# Patient Record
Sex: Female | Born: 1975 | Race: Black or African American | Hispanic: No | Marital: Married | Smoking: Never smoker
Health system: Southern US, Community
[De-identification: ages and names within clinical notes are randomized; demographics above are authoritative.]

## PROBLEM LIST (undated history)

## (undated) DIAGNOSIS — I10 Essential (primary) hypertension: Secondary | ICD-10-CM

## (undated) HISTORY — PX: PARTIAL HYSTERECTOMY: SHX80

---

## 2003-03-13 ENCOUNTER — Other Ambulatory Visit: Payer: Self-pay

## 2004-02-18 ENCOUNTER — Emergency Department: Payer: Self-pay | Admitting: Emergency Medicine

## 2004-08-07 ENCOUNTER — Emergency Department: Payer: Self-pay | Admitting: Internal Medicine

## 2004-11-26 ENCOUNTER — Emergency Department: Payer: Self-pay | Admitting: Emergency Medicine

## 2006-04-13 ENCOUNTER — Emergency Department: Payer: Self-pay | Admitting: Emergency Medicine

## 2007-09-10 ENCOUNTER — Emergency Department: Payer: Self-pay | Admitting: Emergency Medicine

## 2008-09-24 ENCOUNTER — Emergency Department: Payer: Self-pay | Admitting: Emergency Medicine

## 2015-01-12 ENCOUNTER — Emergency Department
Admission: EM | Admit: 2015-01-12 | Discharge: 2015-01-12 | Disposition: A | Discharge status: Home / Self Care | Payer: No Typology Code available for payment source | Attending: Emergency Medicine | Admitting: Emergency Medicine

## 2015-01-12 ENCOUNTER — Encounter: Payer: Self-pay | Admitting: Emergency Medicine

## 2015-01-12 DIAGNOSIS — I10 Essential (primary) hypertension: Secondary | ICD-10-CM | POA: Diagnosis not present

## 2015-01-12 DIAGNOSIS — J029 Acute pharyngitis, unspecified: Secondary | ICD-10-CM | POA: Insufficient documentation

## 2015-01-12 DIAGNOSIS — B349 Viral infection, unspecified: Secondary | ICD-10-CM | POA: Insufficient documentation

## 2015-01-12 HISTORY — DX: Essential (primary) hypertension: I10

## 2015-01-12 LAB — POCT RAPID STREP A: Streptococcus, Group A Screen (Direct): NEGATIVE

## 2015-01-12 MED ORDER — MAGIC MOUTHWASH W/LIDOCAINE: 5.0000 mL | Freq: Four times a day (QID) | ORAL | Status: DC

## 2015-01-12 MED ORDER — PROMETHAZINE-DM 6.25-15 MG/5ML PO SYRP: 5.0000 mL | ORAL_SOLUTION | Freq: Four times a day (QID) | ORAL | Status: DC | PRN

## 2015-01-12 NOTE — Discharge Instructions (Signed)

## 2015-01-12 NOTE — ED Notes (Signed)
Pt C/o sore throat and chills x 1 week. Pt states she has been taking OTC sinus medications with no relief.

## 2015-01-12 NOTE — ED Notes (Signed)
AAOx3.  Skin warm and dry.  NAD 

## 2015-01-12 NOTE — ED Provider Notes (Signed)
[facility] Emergency Department Provider Note  ____________________________________________  Time seen: Approximately 4:54 PM  I have reviewed the triage vital signs and the nursing notes.   HISTORY  Chief Complaint Sore Throat and Chills    HPI Christine Cameron is a 39 y.o. female patient complaining of sore throat and sinus congestion chills and fever for one week. Patient states she's been using over-the-counter sinus medication with no relief. Patient states the sore throat has increased in the last 2 days. Patient said is painful with swallowing even fluids. Patient is rating her pain discomfort as 8/10.Patient describes pain as sharp with swallowing. Patient denies any nausea vomiting diarrhea with this complaint. Patient denies any body aches.  Past Medical History  Diagnosis Date  . Hypertension     There are no active problems to display for this patient.   Past Surgical History  Procedure Laterality Date  . Partial hysterectomy      Current Outpatient Rx  Name  Route  Sig  Dispense  Refill  . magic mouthwash w/lidocaine SOLN   Oral   Take 5 mLs by mouth 4 (four) times daily.   100 mL   0   . promethazine-dextromethorphan (PROMETHAZINE-DM) 6.25-15 MG/5ML syrup   Oral   Take 5 mLs by mouth 4 (four) times daily as needed for cough.   118 mL   0     Allergies Review of patient's allergies indicates no known allergies.  History reviewed. No pertinent family history.  Social History Social History  Substance Use Topics  . Smoking status: Never Smoker   . Smokeless tobacco: Never Used  . Alcohol Use: Yes     Comment: Occassionally    Review of Systems Constitutional: No fever/chills Eyes: No visual changes. ENT: Sore throat and sinus congestion. Cardiovascular: Denies chest pain. Respiratory: Denies shortness of breath. Gastrointestinal: No abdominal pain.  No nausea, no vomiting.  No diarrhea.  No  constipation. Genitourinary: Negative for dysuria. Musculoskeletal: Negative for back pain. Skin: Negative for rash. Neurological: Negative for headaches, focal weakness or numbness. Psychiatric: Endocrine:Hypertension 10-point ROS otherwise negative.  ____________________________________________   PHYSICAL EXAM:  VITAL SIGNS: ED Triage Vitals  Enc Vitals Group     BP 01/12/15 1630 147/93 mmHg     Pulse Rate 01/12/15 1630 71     Resp 01/12/15 1630 18     Temp 01/12/15 1630 98.4 F (36.9 C)     Temp src --      SpO2 01/12/15 1630 100 %     Weight 01/12/15 1630 170 lb (77.111 kg)     Height 01/12/15 1630  (1.626 m)     Head Cir --      Peak Flow --      Pain Score 01/12/15 1631 8     Pain Loc --      Pain Edu? --      Excl. in GC? --     Constitutional: Alert and oriented. Well appearing and in no acute distress. Eyes: Conjunctivae are normal. PERRL. EOMI. Head: Atraumatic. Nose: No congestion/rhinnorhea. Mouth/Throat: Mucous membranes are moist.  Oropharynx erythematous, edematous tonsils without exudate. Neck: No stridor.  No cervical spine tenderness to palpation. Hematological/Lymphatic/Immunilogical: No cervical lymphadenopathy. Cardiovascular: Normal rate, regular rhythm. Grossly normal heart sounds.  Good peripheral circulation. Respiratory: Normal respiratory effort.  No retractions. Lungs CTAB. Gastrointestinal: Soft and nontender. No distention. No abdominal bruits. No CVA tenderness. Musculoskeletal: No lower extremity tenderness nor edema.  No joint effusions.  Neurologic:  Normal speech and language. No gross focal neurologic deficits are appreciated. No gait instability. Skin:  Skin is warm, dry and intact. No rash noted. Psychiatric: Mood and affect are normal. Speech and behavior are normal.  ____________________________________________   LABS (all labs ordered are listed, but only abnormal results are displayed)  Labs Reviewed  CULTURE,  GROUP A STREP ([facility] ONLY)  POCT RAPID STREP A   ____________________________________________  EKG   ____________________________________________  RADIOLOGY   ____________________________________________   PROCEDURES  Procedure(s) performed: None  Critical Care performed: No  ____________________________________________   INITIAL IMPRESSION / ASSESSMENT AND PLAN / ED COURSE  Pertinent labs & imaging results that were available during my care of the patient were reviewed by me and considered in my medical decision making (see chart for details). Viral pharyngitis. Discussed negatives rapid strep results with patient. Advised patient culture is pending. Patient given prescription for Bromfed DM and Magic mouthwash. Patient advised to follow-up ____________________________________________   FINAL CLINICAL IMPRESSION(S) / ED DIAGNOSES  Final diagnoses:  Pharyngitis with viral syndrome      Joni ReiningRonald K Celie Desrochers, PA-C 01/12/15 1711  Darien Ramusavid W Kaminski, MD 01/12/15 2008

## 2015-01-16 LAB — CULTURE, GROUP A STREP (THRC)

## 2018-06-11 ENCOUNTER — Encounter: Payer: Self-pay | Admitting: Emergency Medicine

## 2018-06-11 ENCOUNTER — Emergency Department
Admission: EM | Admit: 2018-06-11 | Discharge: 2018-06-11 | Disposition: A | Discharge status: Home / Self Care | Payer: Commercial Managed Care - PPO | Attending: Emergency Medicine | Admitting: Emergency Medicine

## 2018-06-11 ENCOUNTER — Emergency Department: Payer: Commercial Managed Care - PPO

## 2018-06-11 ENCOUNTER — Other Ambulatory Visit: Payer: Self-pay

## 2018-06-11 DIAGNOSIS — R1030 Lower abdominal pain, unspecified: Secondary | ICD-10-CM | POA: Diagnosis present

## 2018-06-11 DIAGNOSIS — B9689 Other specified bacterial agents as the cause of diseases classified elsewhere: Secondary | ICD-10-CM | POA: Insufficient documentation

## 2018-06-11 DIAGNOSIS — N760 Acute vaginitis: Secondary | ICD-10-CM | POA: Insufficient documentation

## 2018-06-11 DIAGNOSIS — R102 Pelvic and perineal pain: Secondary | ICD-10-CM

## 2018-06-11 DIAGNOSIS — I10 Essential (primary) hypertension: Secondary | ICD-10-CM | POA: Insufficient documentation

## 2018-06-11 LAB — URINALYSIS, COMPLETE (UACMP) WITH MICROSCOPIC
BACTERIA UA: NONE SEEN
Bilirubin Urine: NEGATIVE
Glucose, UA: NEGATIVE mg/dL
Ketones, ur: NEGATIVE mg/dL
LEUKOCYTE UA: NEGATIVE
Nitrite: NEGATIVE
Protein, ur: NEGATIVE mg/dL
Specific Gravity, Urine: 1.011 (ref 1.005–1.030)
pH: 5 (ref 5.0–8.0)

## 2018-06-11 LAB — COMPREHENSIVE METABOLIC PANEL
ALK PHOS: 24 U/L — AB (ref 38–126)
ALT: 16 U/L (ref 0–44)
AST: 19 U/L (ref 15–41)
Albumin: 4.4 g/dL (ref 3.5–5.0)
Anion gap: 8 (ref 5–15)
BUN: 12 mg/dL (ref 6–20)
CO2: 25 mmol/L (ref 22–32)
Calcium: 9.1 mg/dL (ref 8.9–10.3)
Chloride: 103 mmol/L (ref 98–111)
Creatinine, Ser: 0.82 mg/dL (ref 0.44–1.00)
GFR calc Af Amer: >60 mL/min (ref >60)
GFR calc non Af Amer: >60 mL/min (ref >60)
Glucose, Bld: 107 mg/dL — ABNORMAL HIGH (ref 70–99)
Potassium: 3.2 mmol/L — ABNORMAL LOW (ref 3.5–5.1)
Sodium: 136 mmol/L (ref 135–145)
Total Bilirubin: 0.8 mg/dL (ref 0.3–1.2)
Total Protein: 7.6 g/dL (ref 6.5–8.1)

## 2018-06-11 LAB — CBC
HCT: 38.3 % (ref 36.0–46.0)
Hemoglobin: 12.6 g/dL (ref 12.0–15.0)
MCH: 29.4 pg (ref 26.0–34.0)
MCHC: 32.9 g/dL (ref 30.0–36.0)
MCV: 89.5 fL (ref 80.0–100.0)
NRBC: 0 % (ref 0.0–0.2)
Platelets: 370 10*3/uL (ref 150–400)
RBC: 4.28 MIL/uL (ref 3.87–5.11)
RDW: 12.6 % (ref 11.5–15.5)
WBC: 11.1 10*3/uL — ABNORMAL HIGH (ref 4.0–10.5)

## 2018-06-11 LAB — WET PREP, GENITAL
Sperm: NONE SEEN
TRICH WET PREP: NONE SEEN
WBC, Wet Prep HPF POC: NONE SEEN
Yeast Wet Prep HPF POC: NONE SEEN

## 2018-06-11 LAB — LIPASE, BLOOD: Lipase: 29 U/L (ref 11–51)

## 2018-06-11 LAB — CHLAMYDIA/NGC RT PCR (ARMC ONLY)
Chlamydia Tr: NOT DETECTED
N gonorrhoeae: NOT DETECTED

## 2018-06-11 MED ORDER — IOPAMIDOL (ISOVUE-300) INJECTION 61%
30.0000 mL | Freq: Once | INTRAVENOUS | Status: AC
  Administered 2018-06-11: 30 mL via ORAL

## 2018-06-11 MED ORDER — METRONIDAZOLE 500 MG PO TABS: 500.0000 mg | ORAL_TABLET | Freq: Two times a day (BID) | ORAL | 0 refills | Status: DC

## 2018-06-11 MED ORDER — IOHEXOL 300 MG/ML  SOLN
100.0000 mL | Freq: Once | INTRAMUSCULAR | Status: AC | PRN
  Administered 2018-06-11: 100 mL via INTRAVENOUS

## 2018-06-11 MED ORDER — KETOROLAC TROMETHAMINE 30 MG/ML IJ SOLN
30.0000 mg | Freq: Once | INTRAMUSCULAR | Status: AC
  Administered 2018-06-11: 30 mg via INTRAMUSCULAR
  Filled 2018-06-11: qty 1

## 2018-06-11 MED ORDER — METRONIDAZOLE 500 MG PO TABS
500.0000 mg | ORAL_TABLET | Freq: Once | ORAL | Status: AC
  Administered 2018-06-11: 500 mg via ORAL
  Filled 2018-06-11: qty 1

## 2018-06-11 NOTE — ED Notes (Signed)
Drinking oral CT contrast.

## 2018-06-11 NOTE — ED Notes (Signed)
ED Provider at bedside. 

## 2018-06-11 NOTE — ED Notes (Signed)
Returned from ultrasound.

## 2018-06-11 NOTE — ED Triage Notes (Signed)
C/o lower abdominal pain/pelvic pain. Worse with sex but hurts all the time. Denies urinary sx. Denies vaginal discharge. No fevers.  Ambulatory. VSS. Has had hysterectomy.

## 2018-06-11 NOTE — ED Notes (Signed)
Pt finished with oral CT contrast. CT staff informed

## 2018-06-11 NOTE — ED Notes (Signed)
Patient in ultrasound.

## 2018-06-11 NOTE — ED Provider Notes (Signed)
[facility] Emergency Department Provider Note   ____________________________________________    I have reviewed the triage vital signs and the nursing notes.   HISTORY  Chief Complaint Abdominal Pain     HPI Christine Cameron is a 43 y.o. female who presents with complaints of lower abdominal pain.  Patient describes cramping sensation primarily in her pelvis which is been occurring over the last 2 to 3 days.  She denies vaginal discharge.  No dysuria.  No nausea or vomiting.  Has a history of a hysterectomy.  No fevers or chills.  Normal stools.  Some constipation however   Past Medical History:  Diagnosis Date  . Hypertension     There are no active problems to display for this patient.   Past Surgical History:  Procedure Laterality Date  . PARTIAL HYSTERECTOMY      Prior to Admission medications   Medication Sig Start Date End Date Taking? Authorizing Provider  magic mouthwash w/lidocaine SOLN Take 5 mLs by mouth 4 (four) times daily. 01/12/15   Joni Reining, PA-C  metroNIDAZOLE (FLAGYL) 500 MG tablet Take 1 tablet (500 mg total) by mouth 2 (two) times daily after a meal. 06/11/18   Jene Every, MD  promethazine-dextromethorphan (PROMETHAZINE-DM) 6.25-15 MG/5ML syrup Take 5 mLs by mouth 4 (four) times daily as needed for cough. 01/12/15   Joni Reining, PA-C     Allergies Patient has no known allergies.  History reviewed. No pertinent family history.  Social History Social History   Tobacco Use  . Smoking status: Never Smoker  . Smokeless tobacco: Never Used  Substance Use Topics  . Alcohol use: Yes    Comment: Occassionally  . Drug use: No    Review of Systems  Constitutional: No fever/chills Eyes: No visual changes.  ENT: No sore throat. Cardiovascular: Denies chest pain. Respiratory: Denies shortness of breath. Gastrointestinal: As above Genitourinary: As above Musculoskeletal: Negative for back pain. Skin:  Negative for rash. Neurological: Negative for headaches or weakness   ____________________________________________   PHYSICAL EXAM:  VITAL SIGNS: ED Triage Vitals [06/11/18 1556]  Enc Vitals Group     BP (!) 153/95     Pulse Rate 87     Resp 16     Temp 98.7 F (37.1 C)     Temp Source Oral     SpO2 98 %     Weight 76.7 kg (169 lb)     Height 1.6 m (5\' 3" )     Head Circumference      Peak Flow      Pain Score 8     Pain Loc      Pain Edu?      Excl. in GC?     Constitutional: Alert and oriented. No acute distress. Pleasant and interactive Eyes: Conjunctivae are normal.  Head: Atraumatic.  Cardiovascular: Normal rate, regular rhythm. Grossly normal heart sounds.  Good peripheral circulation. Respiratory: Normal respiratory effort.  No retractions. Lungs CTAB. Gastrointestinal: Mild tenderness palpation the left lower quadrant and suprapubically, no distention, no CVA tenderness Genitourinary: Small amount of whitish discharge, otherwise unremarkable exam Musculoskeletal:  Warm and well perfused Neurologic:  Normal speech and language. No gross focal neurologic deficits are appreciated.  Skin:  Skin is warm, dry and intact. No rash noted. Psychiatric: Mood and affect are normal. Speech and behavior are normal.  ____________________________________________   LABS (all labs ordered are listed, but only abnormal results are displayed)  Labs Reviewed  WET  PREP, GENITAL - Abnormal; Notable for the following components:      Result Value   Clue Cells Wet Prep HPF POC PRESENT (*)    All other components within normal limits  COMPREHENSIVE METABOLIC PANEL - Abnormal; Notable for the following components:   Potassium 3.2 (*)    Glucose, Bld 107 (*)    Alkaline Phosphatase 24 (*)    All other components within normal limits  CBC - Abnormal; Notable for the following components:   WBC 11.1 (*)    All other components within normal limits  URINALYSIS, COMPLETE (UACMP)  WITH MICROSCOPIC - Abnormal; Notable for the following components:   Color, Urine STRAW (*)    APPearance CLEAR (*)    Hgb urine dipstick SMALL (*)    All other components within normal limits  CHLAMYDIA/NGC RT PCR ([facility] ONLY)  LIPASE, BLOOD   ____________________________________________  EKG  None ____________________________________________  RADIOLOGY  CT abdomen pelvis demonstrates possible ovarian cyst Sound demonstrates 3.6 mm in her right hemorrhagic cyst, no torsion ____________________________________________   PROCEDURES  Procedure(s) performed: No  Procedures   Critical Care performed: No ____________________________________________   INITIAL IMPRESSION / ASSESSMENT AND PLAN / ED COURSE  Pertinent labs & imaging results that were available during my care of the patient were reviewed by me and considered in my medical decision making (see chart for details).  Patient presents with lower abdominal pain as described above.  Differential includes urinary tract infection, diverticulitis, ovarian cyst.  Will obtain labs, treat with IM Toradol, obtain CT and reevaluate  CT demonstrates possible ovarian cyst, ultrasound performed which confirmed the diagnosis no evidence of torsion.  Patient feeling better after injection  Pelvic exam concerning for clue cells most consistent with bacterial vaginosis this is likely the cause of her discomfort.  Will treat with Flagyl, outpatient follow-up PCP    ____________________________________________   FINAL CLINICAL IMPRESSION(S) / ED DIAGNOSES  Final diagnoses:  Pelvic pain  Bacterial vaginosis        Note:  This document was prepared using Dragon voice recognition software and may include unintentional dictation errors.   Jene Every, MD 06/11/18 [phone]

## 2019-05-22 ENCOUNTER — Other Ambulatory Visit: Payer: Self-pay

## 2019-05-22 ENCOUNTER — Ambulatory Visit
Admission: EM | Admit: 2019-05-22 | Discharge: 2019-05-22 | Disposition: A | Discharge status: Home / Self Care | Payer: 59 | Attending: Urgent Care | Admitting: Urgent Care

## 2019-05-22 ENCOUNTER — Encounter: Payer: Self-pay | Admitting: Emergency Medicine

## 2019-05-22 ENCOUNTER — Ambulatory Visit (INDEPENDENT_AMBULATORY_CARE_PROVIDER_SITE_OTHER): Payer: 59

## 2019-05-22 DIAGNOSIS — R1032 Left lower quadrant pain: Secondary | ICD-10-CM

## 2019-05-22 DIAGNOSIS — K5900 Constipation, unspecified: Secondary | ICD-10-CM | POA: Insufficient documentation

## 2019-05-22 LAB — URINALYSIS, COMPLETE (UACMP) WITH MICROSCOPIC
Bilirubin Urine: NEGATIVE
Glucose, UA: NEGATIVE mg/dL
Hgb urine dipstick: NEGATIVE
Ketones, ur: NEGATIVE mg/dL
Leukocytes,Ua: NEGATIVE
Nitrite: NEGATIVE
Protein, ur: NEGATIVE mg/dL
RBC / HPF: NONE SEEN RBC/hpf (ref 0–5)
Specific Gravity, Urine: 1.01 (ref 1.005–1.030)
pH: 6.5 (ref 5.0–8.0)

## 2019-05-22 MED ORDER — POLYETHYLENE GLYCOL 3350 17 GM/SCOOP PO POWD: 17.0000 g | Freq: Every day | ORAL | 0 refills | Status: AC

## 2019-05-22 MED ORDER — DOCUSATE SODIUM 100 MG PO CAPS: 100.0000 mg | ORAL_CAPSULE | Freq: Two times a day (BID) | ORAL | 0 refills | Status: AC

## 2019-05-22 NOTE — Discharge Instructions (Signed)
It was very nice seeing you today in clinic. Thank you for entrusting me with your care.   Increase fluid, fiber, and water intake. Use medications as prescribed.   Make arrangements to follow up with your regular doctor in 1 week for re-evaluation if not improving. If your symptoms/condition worsens, please seek follow up care either here or in the ER. Please remember, our [facility] providers are "right here with you" when you need [place].   Again, it was my pleasure to take care of you today. Thank you for choosing our clinic. I hope that you start to feel better quickly.   Quentin Mulling, MSN, APRN, FNP-C, CEN Advanced Practice Provider [facility]

## 2019-05-22 NOTE — ED Triage Notes (Signed)
Patient c/o left side pain that started on Sunday. She reports at time it radiating into her back on the left side. Denies urinary symptoms but does reports taking AZO pills for about 2 days.

## 2019-05-22 NOTE — ED Provider Notes (Signed)
[place]   Name: Christine Cameron DOB: Aug 20, 1975 MRN: [id number] CSN: [id number] PCP: [facility]  Arrival date and time:  05/22/19 1002  Chief Complaint:  Flank Pain   NOTE: Prior to seeing the patient today, I have reviewed the triage nursing documentation and vital signs. Clinical staff has updated patient's PMH/PSHx, current medication list, and drug allergies/intolerances to ensure comprehensive history available to assist in medical decision making.   History:   HPI: Christine Cameron is a 44 y.o. female who presents today with complaints of pain in her LEFT lower back with radiation into her flank area. Symptoms intermittent since Sunday. Patient denies nausea, vomiting, and fevers/chills. She has not experienced any urinary symptoms; no dysuria, frequency, urgency, gross hematuria, or malodorous urine. Patient denies vaginal discharge. Patient reports intermittent abdominal pain that feels like gas pain. Last bowel movement was yesterday; patient describes stool as being hard and only a small amount. Patient reports that her pain improves with APAP, IBU, and a hot shower. Additionally, patient took phenazopyridine on Sunday, in addition to increasing her fluid intake (water and cranberry juice), which did not seem to improve her symptoms. Patient denies a history if recurrent urinary tract infection, urolithiasis, and STIs.  Past Medical History:  Diagnosis Date  . Hypertension     Past Surgical History:  Procedure Laterality Date  . PARTIAL HYSTERECTOMY      History reviewed. No pertinent family history.  Social History   Tobacco Use  . Smoking status: Never Smoker  . Smokeless tobacco: Never Used  Substance Use Topics  . Alcohol use: Yes    Comment: Occassionally  . Drug use: No    There are no problems to display for this patient.   Home Medications:    Current Meds  Medication Sig  . hydrochlorothiazide (HYDRODIURIL) 25 MG  tablet Take 25 mg by mouth daily.    Allergies:   Patient has no known allergies.  Review of Systems (ROS):  Review of systems NEGATIVE unless otherwise noted in narrative H&P section.   Vital Signs: Today's Vitals   05/22/19 1033 05/22/19 1034 05/22/19 1037  BP:   (!) 139/105  Pulse:   72  Resp:   18  Temp:   97.9 F (36.6 C)  TempSrc:   Oral  SpO2:   100%  Weight:  180 lb (81.6 kg)   Height:  5\' 3"  (1.6 m)   PainSc: 4       Physical Exam: Physical Exam  Constitutional: She is oriented to person, place, and time and well-developed, well-nourished, and in no distress.  HENT:  Head: Normocephalic and atraumatic.  Eyes: Pupils are equal, round, and reactive to light.  Cardiovascular: Normal rate, regular rhythm, normal heart sounds and intact distal pulses.  Pulmonary/Chest: Effort normal and breath sounds normal.  Abdominal: Soft. Normal appearance and bowel sounds are normal. She exhibits no distension. There is no hepatosplenomegaly. There is abdominal tenderness in the left lower quadrant. There is no rebound and no guarding.  Musculoskeletal:     Lumbar back: Tenderness present. No deformity, lacerations, pain or spasms. Normal range of motion.       Back:     Comments: No midline back pain or gross deformities.   Neurological: She is alert and oriented to person, place, and time. Gait normal.  Skin: Skin is warm and dry. No rash noted. She is not diaphoretic.  Psychiatric: Mood, memory, affect and judgment normal.  Nursing note  and vitals reviewed.   Urgent Care Treatments / Results:   Orders Placed This Encounter  Procedures  . DG Abdomen 1 View  . Urinalysis, Complete w Microscopic    LABS: PLEASE NOTE: all labs that were ordered this encounter are listed, however only abnormal results are displayed. Labs Reviewed  URINALYSIS, COMPLETE (UACMP) WITH MICROSCOPIC - Abnormal; Notable for the following components:      Result Value   Bacteria, UA RARE (*)      All other components within normal limits    EKG: -None  RADIOLOGY: DG Abdomen 1 View  Result Date: 05/22/2019 CLINICAL DATA:  Intermittent LEFT side abdominal pain into low back for 2 days EXAM: ABDOMEN - 1 VIEW COMPARISON:  None FINDINGS: Tubal ligation clips in pelvis. Increased stool in proximal colon. Nonobstructive bowel gas pattern. No bowel dilatation or bowel wall thickening. Minimal biconvex thoracolumbar scoliosis. Osseous mineralization normal. No urinary tract calcification. Few scattered pelvic phleboliths. IMPRESSION: Increased stool in proximal colon. Electronically Signed   By: Ulyses Southward M.D.   On: 05/22/2019 11:41    PROCEDURES: Procedures  MEDICATIONS RECEIVED THIS VISIT: Medications - No data to display  PERTINENT CLINICAL COURSE NOTES/UPDATES:   Initial Impression / Assessment and Plan / Urgent Care Course:  Pertinent labs & imaging results that were available during my care of the patient were personally reviewed by me and considered in my medical decision making (see lab/imaging section of note for values and interpretations).  Christine Cameron is a 44 y.o. female who presents to [facility] today with complaints of Flank Pain  Patient is well appearing overall in clinic today. She does not appear to be in any acute distress. Presenting symptoms (see HPI) and exam as documented above. Symptoms persistent for the last few days. No urinary symptoms, fevers, nausea, or vomiting. No real significant musculoskeletal component on exam. Suspect constipation. Will obtain UA and KUB to further assess.   UA today negative for infection; 0-5 WBC/hpf, 0-5 RBC/hpf, rare bacteria, and no nitrites or LE.  KUB (+) for increased colonic stool burden. No evidence of SBO.   Reviewed findings with patient. Discussed treatment plans with using osmotic laxative (Miralax) and stool softeners (docusate). Discussed need to increased dietary fiber and fluid intake, as well  as need to increase physical activity to promote normal bowel movements. Patient to return call to the clinic if not resolving with the aforementioned conservative interventions interventions.   Discussed follow up with primary care physician in 1 week for re-evaluation. I have reviewed the follow up and strict return precautions for any new or worsening symptoms. Patient is aware of symptoms that would be deemed urgent/emergent, and would thus require further evaluation either here or in the emergency department. At the time of discharge, she verbalized understanding and consent with the discharge plan as it was reviewed with her. All questions were fielded by provider and/or clinic staff prior to patient discharge.    Final Clinical Impressions / Urgent Care Diagnoses:   Final diagnoses:  Constipation, unspecified constipation type    New Prescriptions:  [place] Controlled Substance Registry consulted? Not Applicable  Meds ordered this encounter  Medications  . polyethylene glycol powder (MIRALAX) 17 GM/SCOOP powder    Sig: Take 17 g by mouth daily.    Dispense:  255 g    Refill:  0  . docusate sodium (COLACE) 100 MG capsule    Sig: Take 1 capsule (100 mg total) by mouth every 12 (twelve)  hours.    Dispense:  60 capsule    Refill:  0    Recommended Follow up Care:  Patient encouraged to follow up with the following provider within the specified time frame, or sooner as dictated by the severity of her symptoms. As always, she was instructed that for any urgent/emergent care needs, she should seek care either here or in the emergency department for more immediate evaluation.  Follow-up [place], [facility] In 1 week.   Specialty: General Practice Why: General reassessment of symptoms if not improving Contact information: [place]. [place] [address] [phone]         NOTE: This note was prepared using Dragon dictation  software along with smaller phrase technology. Despite my best ability to proofread, there is the potential that transcriptional errors may still occur from this process, and are completely unintentional.    Karen Kitchens, NP 05/22/19 1156

## 2020-01-24 ENCOUNTER — Other Ambulatory Visit: Payer: Self-pay | Admitting: Family Medicine

## 2020-01-24 DIAGNOSIS — Z1231 Encounter for screening mammogram for malignant neoplasm of breast: Secondary | ICD-10-CM

## 2020-03-19 ENCOUNTER — Other Ambulatory Visit: Payer: Self-pay

## 2020-03-19 ENCOUNTER — Ambulatory Visit
Admission: RE | Admit: 2020-03-19 | Discharge: 2020-03-19 | Disposition: A | Discharge status: Home / Self Care | Payer: 59 | Source: Ambulatory Visit | Attending: Family Medicine | Admitting: Family Medicine

## 2020-03-19 DIAGNOSIS — Z1231 Encounter for screening mammogram for malignant neoplasm of breast: Secondary | ICD-10-CM | POA: Diagnosis present

## 2020-04-02 ENCOUNTER — Other Ambulatory Visit: Payer: Self-pay | Admitting: Family Medicine

## 2020-04-02 DIAGNOSIS — N6489 Other specified disorders of breast: Secondary | ICD-10-CM

## 2020-04-02 DIAGNOSIS — R928 Other abnormal and inconclusive findings on diagnostic imaging of breast: Secondary | ICD-10-CM

## 2020-04-11 ENCOUNTER — Ambulatory Visit
Admission: RE | Admit: 2020-04-11 | Discharge: 2020-04-11 | Disposition: A | Discharge status: Home / Self Care | Payer: 59 | Source: Ambulatory Visit | Attending: Family Medicine | Admitting: Family Medicine

## 2020-04-11 ENCOUNTER — Other Ambulatory Visit: Payer: Self-pay

## 2020-04-11 DIAGNOSIS — R928 Other abnormal and inconclusive findings on diagnostic imaging of breast: Secondary | ICD-10-CM

## 2020-04-11 DIAGNOSIS — N6489 Other specified disorders of breast: Secondary | ICD-10-CM | POA: Diagnosis present

## 2021-03-26 ENCOUNTER — Other Ambulatory Visit: Payer: Self-pay | Admitting: Family Medicine

## 2021-03-26 DIAGNOSIS — Z1231 Encounter for screening mammogram for malignant neoplasm of breast: Secondary | ICD-10-CM

## 2021-03-31 ENCOUNTER — Other Ambulatory Visit: Payer: Self-pay

## 2021-03-31 DIAGNOSIS — Z1211 Encounter for screening for malignant neoplasm of colon: Secondary | ICD-10-CM

## 2021-03-31 MED ORDER — PEG 3350-KCL-NA BICARB-NACL 420 G PO SOLR: 4000.0000 mL | Freq: Once | ORAL | 0 refills | Status: AC

## 2021-03-31 NOTE — Progress Notes (Signed)
Gastroenterology Pre-Procedure Review  Request Date: 05/01/2021 Requesting Physician: Dr. Tobi Bastos   PATIENT REVIEW QUESTIONS: The patient responded to the following health history questions as indicated:    1. Are you having any GI issues? no 2. Do you have a personal history of Polyps? no 3. Do you have a family history of Colon Cancer or Polyps? no 4. Diabetes Mellitus? no 5. Joint replacements in the past 12 months?no 6. Major health problems in the past 3 months?no 7. Any artificial heart valves, MVP, or defibrillator?no    MEDICATIONS & ALLERGIES:    Patient reports the following regarding taking any anticoagulation/antiplatelet therapy:   Plavix, Coumadin, Eliquis, Xarelto, Lovenox, Pradaxa, Brilinta, or Effient? no Aspirin? no  Patient confirms/reports the following medications:  Current Outpatient Medications  Medication Sig Dispense Refill   polyethylene glycol-electrolytes (NULYTELY) 420 g solution Take 4,000 mLs by mouth once for 1 dose. 4000 mL 0   docusate sodium (COLACE) 100 MG capsule Take 1 capsule (100 mg total) by mouth every 12 (twelve) hours. 60 capsule 0   hydrochlorothiazide (HYDRODIURIL) 25 MG tablet Take 25 mg by mouth daily.     polyethylene glycol powder (MIRALAX) 17 GM/SCOOP powder Take 17 g by mouth daily. 255 g 0   No current facility-administered medications for this visit.    Patient confirms/reports the following allergies:  No Known Allergies  No orders of the defined types were placed in this encounter.   AUTHORIZATION INFORMATION Primary Insurance: 1D#: Group #:  Secondary Insurance: 1D#: Group #:  SCHEDULE INFORMATION: Date:  02/ Time: Location:

## 2021-04-01 ENCOUNTER — Telehealth: Payer: Self-pay

## 2021-04-01 NOTE — Telephone Encounter (Signed)
Called patient she will fax [place] a copy of her insurance card gave her fax number [phone]

## 2021-04-06 ENCOUNTER — Telehealth: Payer: Self-pay

## 2021-04-06 ENCOUNTER — Telehealth: Payer: Self-pay | Admitting: Gastroenterology

## 2021-04-06 NOTE — Telephone Encounter (Signed)
Patient wants to cancel procedure(insurance is out of network).

## 2021-04-06 NOTE — Telephone Encounter (Signed)
CALLED ENDO AND CANCELED PROCEDURE PATIENT STATES SHE IS OUT OF OUR NETWORK

## 2021-04-13 ENCOUNTER — Other Ambulatory Visit: Payer: Self-pay

## 2021-04-13 ENCOUNTER — Ambulatory Visit
Admission: RE | Admit: 2021-04-13 | Discharge: 2021-04-13 | Disposition: A | Discharge status: Home / Self Care | Payer: BLUE CROSS/BLUE SHIELD | Source: Ambulatory Visit | Attending: Family Medicine | Admitting: Family Medicine

## 2021-04-13 DIAGNOSIS — Z1231 Encounter for screening mammogram for malignant neoplasm of breast: Secondary | ICD-10-CM | POA: Diagnosis not present

## 2021-05-01 ENCOUNTER — Ambulatory Visit: Admit: 2021-05-01 | Payer: BLUE CROSS/BLUE SHIELD | Admitting: Gastroenterology

## 2021-05-01 SURGERY — COLONOSCOPY WITH PROPOFOL
Anesthesia: General

## 2021-08-02 ENCOUNTER — Ambulatory Visit
Admission: EM | Admit: 2021-08-02 | Discharge: 2021-08-02 | Disposition: A | Discharge status: Home / Self Care | Payer: BLUE CROSS/BLUE SHIELD | Attending: Emergency Medicine | Admitting: Emergency Medicine

## 2021-08-02 ENCOUNTER — Other Ambulatory Visit: Payer: Self-pay

## 2021-08-02 ENCOUNTER — Encounter: Payer: Self-pay | Admitting: Emergency Medicine

## 2021-08-02 ENCOUNTER — Ambulatory Visit (INDEPENDENT_AMBULATORY_CARE_PROVIDER_SITE_OTHER): Payer: BLUE CROSS/BLUE SHIELD

## 2021-08-02 DIAGNOSIS — S92251A Displaced fracture of navicular [scaphoid] of right foot, initial encounter for closed fracture: Secondary | ICD-10-CM

## 2021-08-02 DIAGNOSIS — M25571 Pain in right ankle and joints of right foot: Secondary | ICD-10-CM

## 2021-08-02 MED ORDER — HYDROCODONE-ACETAMINOPHEN 5-325 MG PO TABS: 1.0000 | ORAL_TABLET | Freq: Four times a day (QID) | ORAL | 0 refills | Status: DC | PRN
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
## 2021-08-02 NOTE — Discharge Instructions (Signed)
Wear the cam boot at all times when you are up and moving.  You can take it off to bathe and also for bed. ? ?Use the crutches for assisted weightbearing and walking. ? ?Use over-the-counter Tylenol and ibuprofen according to the package instructions as needed for mild to moderate pain. ? ?Use the Norco as needed for severe pain.  Be mindful that this also contains Tylenol so do not take more than 4000 mg of Tylenol a day.  Also do not drink alcohol or drive if you take this medication.  Do not operate heavy machinery. ? ?I have referred you to podiatry for a follow-up, they will call you to make the appointment. ? ?Return for reevaluation, or see your PCP, for worsening or continued symptoms. ?
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
## 2021-08-07 ENCOUNTER — Encounter: Payer: Self-pay | Admitting: Podiatry

## 2021-08-07 ENCOUNTER — Ambulatory Visit (INDEPENDENT_AMBULATORY_CARE_PROVIDER_SITE_OTHER): Payer: No Typology Code available for payment source | Admitting: Podiatry

## 2021-08-07 DIAGNOSIS — S92251A Displaced fracture of navicular [scaphoid] of right foot, initial encounter for closed fracture: Secondary | ICD-10-CM | POA: Diagnosis not present

## 2021-08-07 MED ORDER — MELOXICAM 15 MG PO TABS: 15.0000 mg | ORAL_TABLET | Freq: Every day | ORAL | 1 refills | Status: AC
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
## 2021-08-10 ENCOUNTER — Telehealth: Payer: Self-pay | Admitting: Podiatry

## 2021-08-10 NOTE — Telephone Encounter (Signed)
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 She's unsure on what to do next. ? ?Please advise ?

## 2021-08-10 NOTE — Telephone Encounter (Signed)
We could write the patient for more detailed doctors note explaining absolute minimal walking in the cam boot, rest as needed, and sedentary work only.  Please reach out to the patient and see if she would like to have this note.  Thanks, Dr. Logan Bores

## 2021-08-11 ENCOUNTER — Encounter: Payer: Self-pay | Admitting: Podiatry

## 2021-08-11 ENCOUNTER — Other Ambulatory Visit: Payer: Self-pay | Admitting: Podiatry

## 2021-08-11 DIAGNOSIS — S92251A Displaced fracture of navicular [scaphoid] of right foot, initial encounter for closed fracture: Secondary | ICD-10-CM

## 2021-08-11 NOTE — Telephone Encounter (Signed)
Pt called back about work note and has agreed to accept the letter recommended. The letter has been created and printed for patient to stop by and pick up later today. ?

## 2021-08-11 NOTE — Progress Notes (Signed)
PRN

## 2021-08-12 NOTE — Telephone Encounter (Signed)
Thank you, Dr. Devon Pretty

## 2021-08-19 ENCOUNTER — Other Ambulatory Visit: Payer: Self-pay | Admitting: Podiatry

## 2021-08-19 ENCOUNTER — Telehealth: Payer: Self-pay | Admitting: Podiatry

## 2021-08-19 MED ORDER — HYDROCODONE-ACETAMINOPHEN 5-325 MG PO TABS: 1.0000 | ORAL_TABLET | Freq: Four times a day (QID) | ORAL | 0 refills | Status: AC | PRN

## 2021-08-19 NOTE — Telephone Encounter (Signed)
Pt called asking for a pain medication refill to be sent in please.

## 2021-08-20 NOTE — Telephone Encounter (Signed)
Left message for pt that the medication was sent into the pharmacy last night and to call if any further questions.

## 2021-09-09 ENCOUNTER — Ambulatory Visit (INDEPENDENT_AMBULATORY_CARE_PROVIDER_SITE_OTHER): Payer: No Typology Code available for payment source | Admitting: Podiatry

## 2021-09-09 ENCOUNTER — Ambulatory Visit (INDEPENDENT_AMBULATORY_CARE_PROVIDER_SITE_OTHER): Payer: No Typology Code available for payment source

## 2021-09-09 ENCOUNTER — Encounter: Payer: Self-pay | Admitting: Podiatry

## 2021-09-09 DIAGNOSIS — S92251A Displaced fracture of navicular [scaphoid] of right foot, initial encounter for closed fracture: Secondary | ICD-10-CM
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
## 2021-09-11 ENCOUNTER — Ambulatory Visit: Payer: No Typology Code available for payment source | Admitting: Podiatry

## 2023-01-09 ENCOUNTER — Other Ambulatory Visit: Payer: Self-pay

## 2023-01-09 ENCOUNTER — Emergency Department
Admission: EM | Admit: 2023-01-09 | Discharge: 2023-01-10 | Disposition: A | Discharge status: Home / Self Care | Payer: No Typology Code available for payment source | Attending: Emergency Medicine | Admitting: Emergency Medicine

## 2023-01-09 DIAGNOSIS — R000 Tachycardia, unspecified: Secondary | ICD-10-CM | POA: Insufficient documentation

## 2023-01-09 DIAGNOSIS — T7840XA Allergy, unspecified, initial encounter: Secondary | ICD-10-CM | POA: Insufficient documentation

## 2023-01-09 DIAGNOSIS — I10 Essential (primary) hypertension: Secondary | ICD-10-CM | POA: Insufficient documentation

## 2023-01-09 DIAGNOSIS — Z79899 Other long term (current) drug therapy: Secondary | ICD-10-CM | POA: Insufficient documentation

## 2023-01-09 DIAGNOSIS — T783XXA Angioneurotic edema, initial encounter: Secondary | ICD-10-CM | POA: Insufficient documentation

## 2023-01-09 MED ORDER — METHYLPREDNISOLONE SODIUM SUCC 125 MG IJ SOLR
125.0000 mg | Freq: Once | INTRAMUSCULAR | Status: AC
  Administered 2023-01-09: 125 mg via INTRAVENOUS
  Filled 2023-01-09: qty 2

## 2023-01-09 MED ORDER — EPINEPHRINE 0.3 MG/0.3ML IJ SOAJ: 0.3000 mg | INTRAMUSCULAR | 1 refills | Status: AC | PRN

## 2023-01-09 MED ORDER — DIPHENHYDRAMINE HCL 50 MG/ML IJ SOLN
25.0000 mg | Freq: Once | INTRAMUSCULAR | Status: AC
  Administered 2023-01-09: 25 mg via INTRAVENOUS
  Filled 2023-01-09: qty 1

## 2023-01-09 MED ORDER — EPINEPHRINE 0.3 MG/0.3ML IJ SOAJ
0.3000 mg | Freq: Once | INTRAMUSCULAR | Status: DC
  Filled 2023-01-09: qty 0.3

## 2023-01-09 MED ORDER — PREDNISONE 20 MG PO TABS: 40.0000 mg | ORAL_TABLET | Freq: Every day | ORAL | 0 refills | Status: AC

## 2023-01-09 MED ORDER — FAMOTIDINE IN NACL 20-0.9 MG/50ML-% IV SOLN
20.0000 mg | Freq: Once | INTRAVENOUS | Status: AC
  Administered 2023-01-09: 20 mg via INTRAVENOUS
  Filled 2023-01-09: qty 50

## 2023-01-09 NOTE — ED Triage Notes (Signed)
BIB with report of possible allergic reaction, tongue swelling. Administered epi-pen prior to medic arrival

## 2023-01-09 NOTE — ED Provider Notes (Signed)
[facility] Provider Note    Event Date/Time   First MD Initiated Contact with Patient 01/09/23 [phone]     (approximate)   History   Allergic Reaction (/BIB with report of possible allergic reaction, tongue swelling. Administered epi-pen prior to medic arrival//)   HPI  Christine Cameron is a 47 y.o. female brought to the ED via EMS from home with a chief complaint of allergic reaction.  Patient states she has had several allergic reactions over the past few months, all seemingly related to food.  Has not yet had allergy testing due to changing jobs and insurances.  Ate a chicken wrap last night and thinks she may be allergic to the seasoning.  Awoke this morning with right-sided tongue swelling.  Self-administered EpiPen prior to medic arrival.  Denies chest pain, shortness of breath, abdominal pain, nausea, vomiting or diarrhea.     Past Medical History   Past Medical History:  Diagnosis Date   Hypertension      Active Problem List  There are no problems to display for this patient.    Past Surgical History   Past Surgical History:  Procedure Laterality Date   PARTIAL HYSTERECTOMY       Home Medications   Prior to Admission medications   Medication Sig Start Date End Date Taking? Authorizing Provider  amLODipine (NORVASC) 5 MG tablet Take 5 mg by mouth daily. 06/24/21   [provider]  docusate sodium (COLACE) 100 MG capsule Take 1 capsule (100 mg total) by mouth every 12 (twelve) hours. 05/22/19   Verlee Monte, NP  hydrochlorothiazide (HYDRODIURIL) 25 MG tablet Take 25 mg by mouth daily.    [provider]  HYDROcodone-acetaminophen (NORCO/VICODIN) 5-325 MG tablet Take 1-2 tablets by mouth every 6 (six) hours as needed. 08/19/21   Felecia Shelling, DPM  meloxicam (MOBIC) 15 MG tablet Take 1 tablet (15 mg total) by mouth daily. 08/07/21   Felecia Shelling, DPM  polyethylene glycol powder (MIRALAX) 17 GM/SCOOP powder Take 17 g by  mouth daily. 05/22/19   Verlee Monte, NP     Allergies  Patient has no known allergies.   Family History   Family History  Problem Relation Age of Onset   Breast cancer Neg Hx      Physical Exam  Triage Vital Signs: ED Triage Vitals  Encounter Vitals Group     BP --      Systolic BP Percentile --      Diastolic BP Percentile --      Pulse --      Resp --      Temp --      Temp src --      SpO2 01/09/23 0640 100 %     Weight 01/09/23 0643 175 lb (79.4 kg)     Height 01/09/23 0643 5\' 3"  (1.6 m)     Head Circumference --      Peak Flow --      Pain Score 01/09/23 0643 0     Pain Loc --      Pain Education --      Exclude from Growth Chart --     Updated Vital Signs: BP (!) 136/99 (BP Location: Right Arm)   Pulse 80   Temp 97.8 F (36.6 C) (Oral)   Resp 18   Ht 5\' 3"  (1.6 m)   Wt 79.4 kg   SpO2 100%   BMI 31.00 kg/m  General: Awake, mild distress.  CV:  Mildly tachycardic.  Good peripheral perfusion.  Resp:  Normal effort.  CTAB. Abd:  Nontender.  No distention.  Other:  Mild right-sided tongue swelling.  Posterior oropharynx is clear.  Phonation intact.  There is no hoarse or muffled voice.  There is no drooling.  Tolerating secretions well.  No urticaria.  No neck swelling.   ED Results / Procedures / Treatments  Labs (all labs ordered are listed, but only abnormal results are displayed) Labs Reviewed - No data to display   EKG  None   RADIOLOGY None   Official radiology report(s): No results found.   PROCEDURES:  Critical Care performed: No  .1-3 Lead EKG Interpretation  Performed by: Irean Hong, MD Authorized by: Irean Hong, MD     Interpretation: abnormal     ECG rate:  105   ECG rate assessment: tachycardic     Rhythm: sinus tachycardia     Ectopy: none     Conduction: normal   Comments:     Patient placed on cardiac monitor to evaluate for arrhythmias    MEDICATIONS ORDERED IN ED: Medications  diphenhydrAMINE  (BENADRYL) injection 25 mg (has no administration in time range)  famotidine (PEPCID) IVPB 20 mg premix (has no administration in time range)  methylPREDNISolone sodium succinate (SOLU-MEDROL) 125 mg/2 mL injection 125 mg (has no administration in time range)     IMPRESSION / MDM / ASSESSMENT AND PLAN / ED COURSE  I reviewed the triage vital signs and the nursing notes.                             47 year old female presenting with acute allergic reaction, angioedema.  Will administer IV allergic cocktail consisting of Benadryl, Pepcid and Solu-Medrol.  Will monitor and care for patient for minimum of 3 hours.  Patient's presentation is most consistent with acute presentation with potential threat to life or bodily function.  The patient is on the cardiac monitor to evaluate for evidence of arrhythmia and/or significant heart rate changes.  1610 Care transferred to Dr. Roxan Hockey at change of shift pending period of observation and disposition.  FINAL CLINICAL IMPRESSION(S) / ED DIAGNOSES   Final diagnoses:  Allergic reaction, initial encounter  Angioedema, initial encounter     Rx / DC Orders   ED Discharge Orders     None        Note:  This document was prepared using Dragon voice recognition software and may include unintentional dictation errors.   Irean Hong, MD 01/09/23 [phone]

## 2023-01-09 NOTE — ED Provider Notes (Signed)
Patient received in signout from Dr. Dolores Frame pending follow-up additional observation here in the ER after allergic reaction some mild angioedema of the tongue.  Symptoms at this time are extremely mild.  Symptoms are improving per patient.  Do not feel that further diagnostic testing clinically indicated or prolonged observation at this time I think she is stable and appropriate for outpatient follow-up.   Willy Eddy, MD 01/09/23 1000

## 2023-05-03 IMAGING — MG MM DIGITAL SCREENING BILAT W/ TOMO AND CAD
8 series · 8 of 24 positions shown · non-contrast
Comparison: Previous exam(s).

CLINICAL DATA: Screening.

EXAM:
DIGITAL SCREENING BILATERAL MAMMOGRAM WITH TOMOSYNTHESIS AND CAD
TECHNIQUE: Bilateral screening digital craniocaudal and mediolateral oblique
mammograms were obtained. Bilateral screening digital breast
tomosynthesis was performed. The images were evaluated with
computer-aided detection.

[L CC synth-2D]
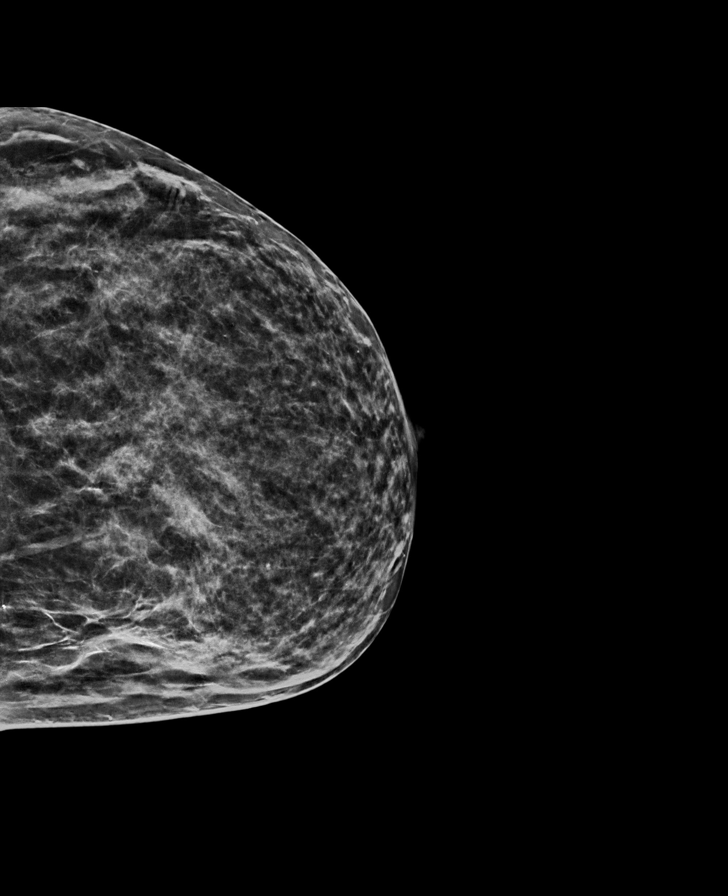

[R CC synth-2D]
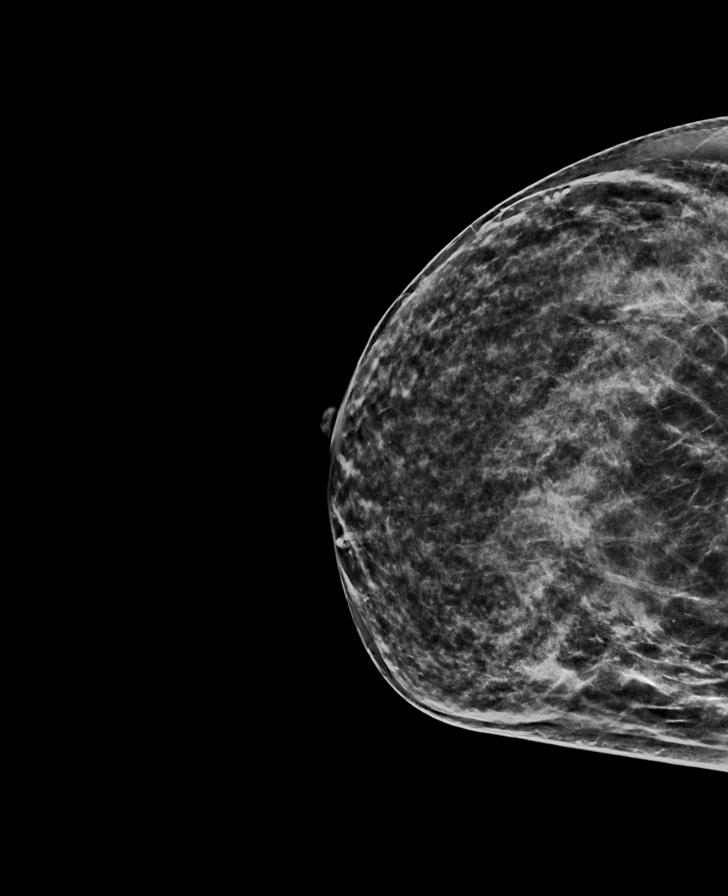

[L MLO synth-2D]
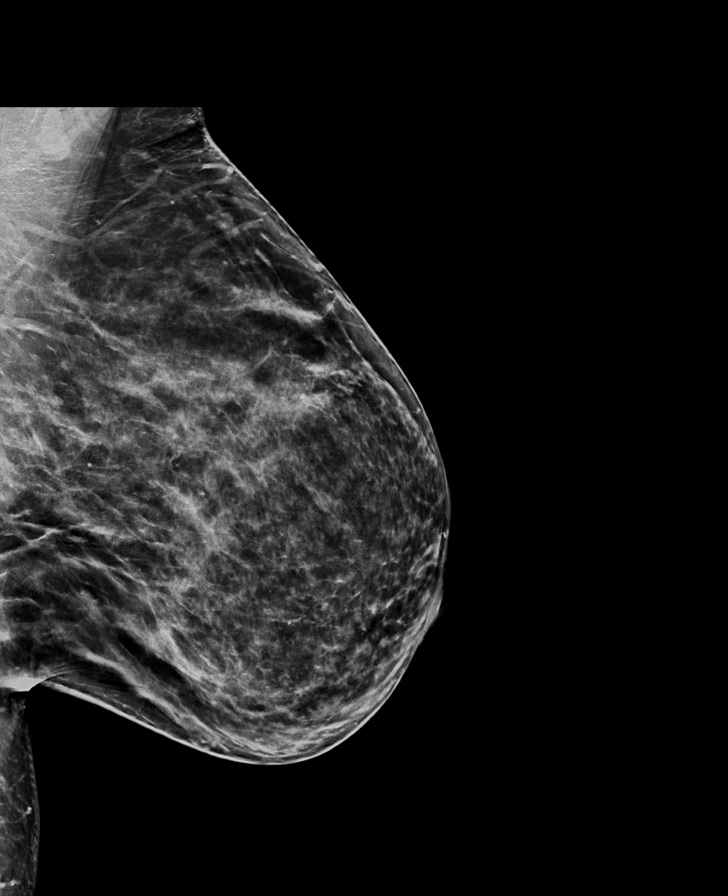

[R MLO synth-2D]
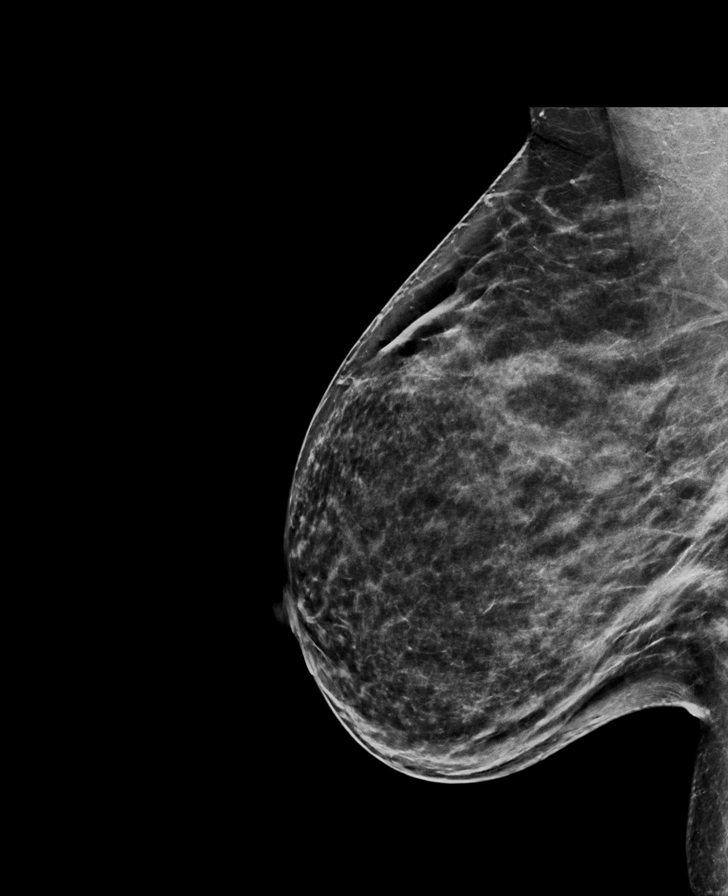

[L MLO tomo · tomo slice 39/78.0]
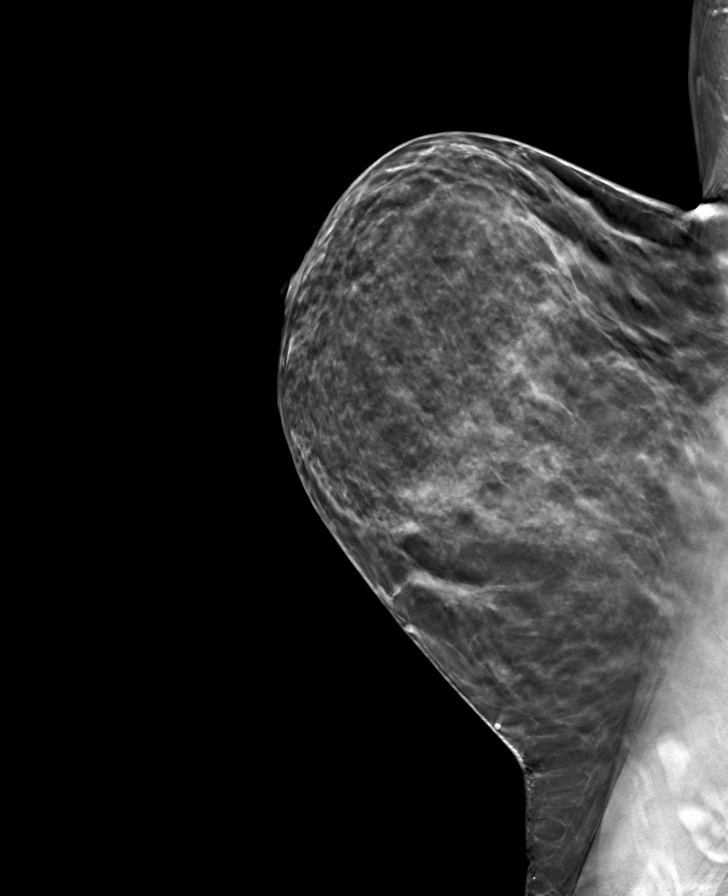

[R MLO tomo · tomo slice 41/81.0]
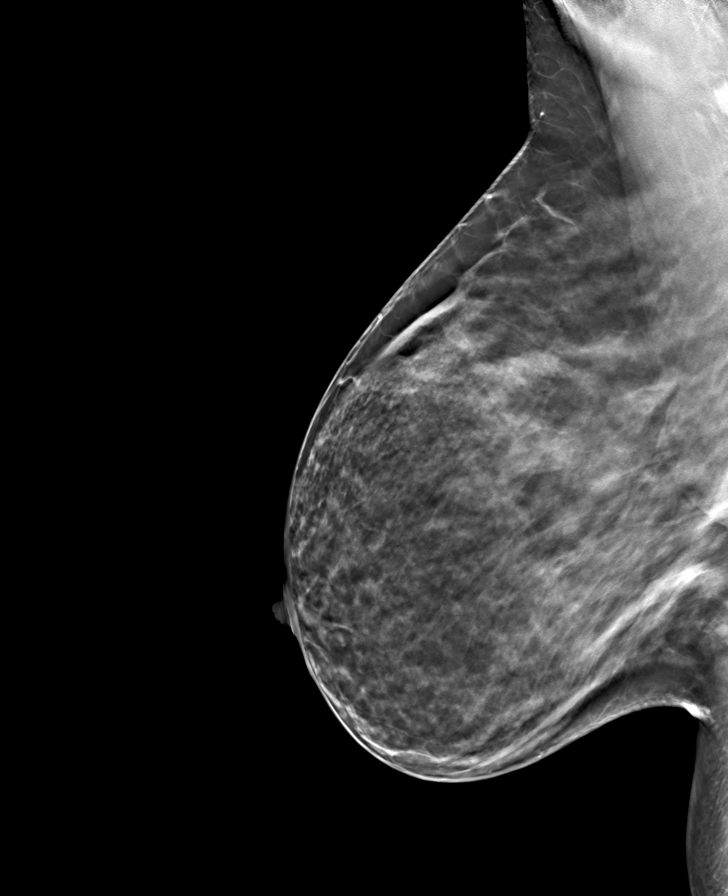

[R CC tomo · tomo slice 35/70.0]
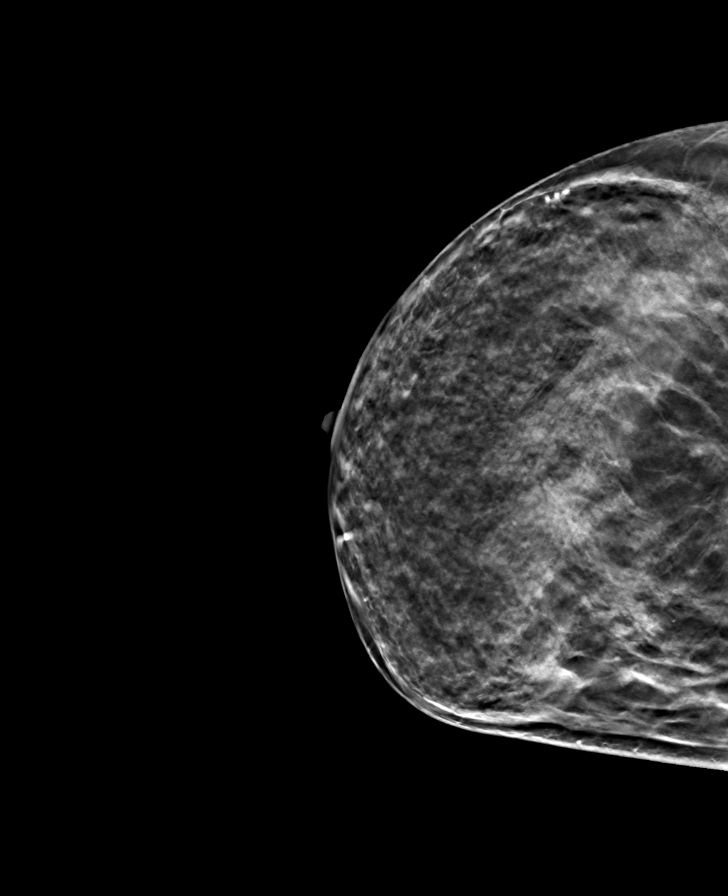

[L CC tomo · tomo slice 34/67.0]
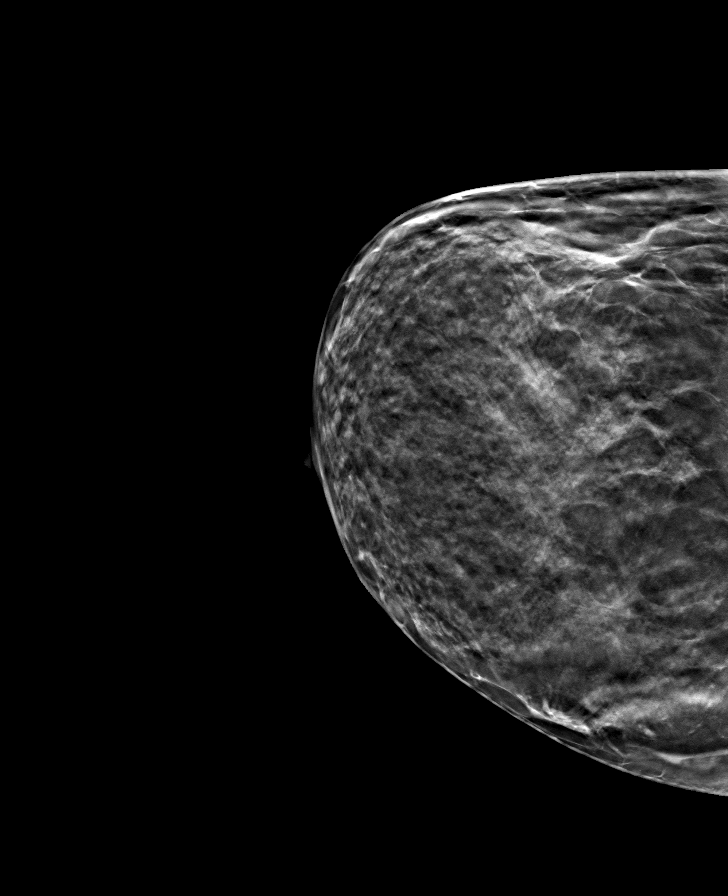

[8 of 24 positions shown; findings below may reference images not displayed]

ACR Breast Density Category c: The breast tissue is heterogeneously
dense, which may obscure small masses.
FINDINGS: There are no findings suspicious for malignancy.
IMPRESSION: No mammographic evidence of malignancy. A result letter of this
screening mammogram will be mailed directly to the patient.

RECOMMENDATION:
Screening mammogram in one year. (Code:Q3-W-BC3)

BI-RADS CATEGORY  1: Negative.
